# Patient Record
Sex: Female | Born: 1995 | Race: Asian | Hispanic: No | Marital: Single | Smoking: Never smoker
Health system: Southern US, Community
[De-identification: ages and names within clinical notes are randomized; demographics above are authoritative.]

---

## 2017-03-24 ENCOUNTER — Emergency Department (HOSPITAL_COMMUNITY): Payer: BLUE CROSS/BLUE SHIELD | Admitting: Anesthesiology

## 2017-03-24 ENCOUNTER — Ambulatory Visit (HOSPITAL_COMMUNITY)
Admission: EM | Admit: 2017-03-24 | Discharge: 2017-03-24 | Disposition: A | Discharge status: Home / Self Care | Payer: BLUE CROSS/BLUE SHIELD | Attending: Emergency Medicine | Admitting: Emergency Medicine

## 2017-03-24 ENCOUNTER — Other Ambulatory Visit: Payer: Self-pay

## 2017-03-24 ENCOUNTER — Emergency Department (HOSPITAL_COMMUNITY): Payer: BLUE CROSS/BLUE SHIELD

## 2017-03-24 ENCOUNTER — Encounter (HOSPITAL_COMMUNITY)
Admission: EM | Disposition: A | Discharge status: Home / Self Care | Payer: Self-pay | Source: Home / Self Care | Attending: Emergency Medicine

## 2017-03-24 ENCOUNTER — Encounter (HOSPITAL_COMMUNITY): Payer: Self-pay | Admitting: Emergency Medicine

## 2017-03-24 DIAGNOSIS — S65012A Laceration of ulnar artery at wrist and hand level of left arm, initial encounter: Secondary | ICD-10-CM | POA: Diagnosis not present

## 2017-03-24 DIAGNOSIS — S66127A Laceration of flexor muscle, fascia and tendon of left little finger at wrist and hand level, initial encounter: Secondary | ICD-10-CM | POA: Diagnosis not present

## 2017-03-24 DIAGNOSIS — S65312A Laceration of deep palmar arch of left hand, initial encounter: Secondary | ICD-10-CM | POA: Insufficient documentation

## 2017-03-24 DIAGNOSIS — S66123A Laceration of flexor muscle, fascia and tendon of left middle finger at wrist and hand level, initial encounter: Secondary | ICD-10-CM | POA: Insufficient documentation

## 2017-03-24 DIAGNOSIS — W458XXA Other foreign body or object entering through skin, initial encounter: Secondary | ICD-10-CM | POA: Insufficient documentation

## 2017-03-24 DIAGNOSIS — S61412A Laceration without foreign body of left hand, initial encounter: Secondary | ICD-10-CM

## 2017-03-24 DIAGNOSIS — Y93G1 Activity, food preparation and clean up: Secondary | ICD-10-CM | POA: Insufficient documentation

## 2017-03-24 DIAGNOSIS — S66125A Laceration of flexor muscle, fascia and tendon of left ring finger at wrist and hand level, initial encounter: Secondary | ICD-10-CM | POA: Diagnosis not present

## 2017-03-24 HISTORY — PX: INCISION AND DRAINAGE OF WOUND: SHX1803

## 2017-03-24 LAB — CBC WITH DIFFERENTIAL/PLATELET
BASOS ABS: 0 10*3/uL (ref 0.0–0.1)
BASOS PCT: 1 %
Eosinophils Absolute: 0.1 10*3/uL (ref 0.0–0.7)
Eosinophils Relative: 2 %
HEMATOCRIT: 38.6 % (ref 36.0–46.0)
HEMOGLOBIN: 13 g/dL (ref 12.0–15.0)
Lymphocytes Relative: 34 %
Lymphs Abs: 2.7 10*3/uL (ref 0.7–4.0)
MCH: 30.2 pg (ref 26.0–34.0)
MCHC: 33.7 g/dL (ref 30.0–36.0)
MCV: 89.6 fL (ref 78.0–100.0)
MONOS PCT: 5 %
Monocytes Absolute: 0.4 10*3/uL (ref 0.1–1.0)
NEUTROS ABS: 4.7 10*3/uL (ref 1.7–7.7)
NEUTROS PCT: 58 %
Platelets: 312 10*3/uL (ref 150–400)
RBC: 4.31 MIL/uL (ref 3.87–5.11)
RDW: 12.6 % (ref 11.5–15.5)
WBC: 8 10*3/uL (ref 4.0–10.5)

## 2017-03-24 SURGERY — IRRIGATION AND DEBRIDEMENT WOUND
Anesthesia: General | Site: Hand | Laterality: Left

## 2017-03-24 MED ORDER — ONDANSETRON HCL 4 MG/2ML IJ SOLN
INTRAMUSCULAR | Status: DC | PRN
  Administered 2017-03-24: 4 mg via INTRAVENOUS

## 2017-03-24 MED ORDER — LACTATED RINGERS IV SOLN
INTRAVENOUS | Status: DC | PRN
  Administered 2017-03-24: 02:00:00 via INTRAVENOUS

## 2017-03-24 MED ORDER — FENTANYL CITRATE (PF) 100 MCG/2ML IJ SOLN
INTRAMUSCULAR | Status: AC
  Filled 2017-03-24: qty 2

## 2017-03-24 MED ORDER — KETOROLAC TROMETHAMINE 30 MG/ML IJ SOLN
INTRAMUSCULAR | Status: DC | PRN
  Administered 2017-03-24: 30 mg via INTRAVENOUS

## 2017-03-24 MED ORDER — DEXAMETHASONE SODIUM PHOSPHATE 10 MG/ML IJ SOLN
INTRAMUSCULAR | Status: DC | PRN
  Administered 2017-03-24: 10 mg via INTRAVENOUS

## 2017-03-24 MED ORDER — NORETHIN ACE-ETH ESTRAD-FE 1.5-30 MG-MCG PO TABS: 1.0000 | ORAL_TABLET | Freq: Every day | ORAL | Status: DC

## 2017-03-24 MED ORDER — MIDAZOLAM HCL 5 MG/5ML IJ SOLN
INTRAMUSCULAR | Status: DC | PRN
  Administered 2017-03-24: 2 mg via INTRAVENOUS

## 2017-03-24 MED ORDER — FENTANYL CITRATE (PF) 100 MCG/2ML IJ SOLN
INTRAMUSCULAR | Status: DC | PRN
  Administered 2017-03-24: 50 ug via INTRAVENOUS
  Administered 2017-03-24: 25 ug via INTRAVENOUS
  Administered 2017-03-24: 50 ug via INTRAVENOUS
  Administered 2017-03-24: 100 ug via INTRAVENOUS
  Administered 2017-03-24 (×3): 25 ug via INTRAVENOUS

## 2017-03-24 MED ORDER — 0.9 % SODIUM CHLORIDE (POUR BTL) OPTIME
TOPICAL | Status: DC | PRN
  Administered 2017-03-24: 1000 mL

## 2017-03-24 MED ORDER — HYDROCODONE-ACETAMINOPHEN 5-325 MG PO TABS: 1.0000 | ORAL_TABLET | ORAL | 0 refills | Status: AC | PRN

## 2017-03-24 MED ORDER — CEPHALEXIN 250 MG PO CAPS: 250.0000 mg | ORAL_CAPSULE | Freq: Four times a day (QID) | ORAL | 0 refills | Status: AC

## 2017-03-24 MED ORDER — LACTATED RINGERS IV SOLN: INTRAVENOUS | Status: DC

## 2017-03-24 MED ORDER — HYDROMORPHONE HCL 1 MG/ML IJ SOLN: 0.2500 mg | INTRAMUSCULAR | Status: DC | PRN

## 2017-03-24 MED ORDER — LIDOCAINE-EPINEPHRINE (PF) 2 %-1:200000 IJ SOLN
30.0000 mL | Freq: Once | INTRAMUSCULAR | Status: AC
  Administered 2017-03-24: 30 mL via INTRADERMAL

## 2017-03-24 MED ORDER — CEFAZOLIN SODIUM-DEXTROSE 1-4 GM/50ML-% IV SOLN
1.0000 g | Freq: Once | INTRAVENOUS | Status: AC
  Administered 2017-03-24: 2 g via INTRAVENOUS

## 2017-03-24 MED ORDER — SUCCINYLCHOLINE CHLORIDE 200 MG/10ML IV SOSY
PREFILLED_SYRINGE | INTRAVENOUS | Status: DC | PRN
Start: 2017-03-24 — End: 2017-03-24
  Administered 2017-03-24: 120 mg via INTRAVENOUS

## 2017-03-24 MED ORDER — CEFAZOLIN SODIUM-DEXTROSE 2-4 GM/100ML-% IV SOLN
INTRAVENOUS | Status: AC
  Filled 2017-03-24: qty 100

## 2017-03-24 MED ORDER — LIDOCAINE-EPINEPHRINE (PF) 2 %-1:200000 IJ SOLN
INTRAMUSCULAR | Status: AC
  Filled 2017-03-24: qty 20

## 2017-03-24 MED ORDER — PROPOFOL 10 MG/ML IV BOLUS
INTRAVENOUS | Status: DC | PRN
  Administered 2017-03-24: 160 mg via INTRAVENOUS
  Administered 2017-03-24: 30 mg via INTRAVENOUS

## 2017-03-24 MED ORDER — LIDOCAINE 2% (20 MG/ML) 5 ML SYRINGE
INTRAMUSCULAR | Status: DC | PRN
Start: 2017-03-24 — End: 2017-03-24
  Administered 2017-03-24: 50 mg via INTRAVENOUS

## 2017-03-24 MED ORDER — PROPOFOL 10 MG/ML IV BOLUS
INTRAVENOUS | Status: AC
  Filled 2017-03-24: qty 20

## 2017-03-24 MED ORDER — MIDAZOLAM HCL 2 MG/2ML IJ SOLN
INTRAMUSCULAR | Status: AC
  Filled 2017-03-24: qty 2

## 2017-03-24 MED ORDER — TETANUS-DIPHTH-ACELL PERTUSSIS 5-2.5-18.5 LF-MCG/0.5 IM SUSP: 0.5000 mL | Freq: Once | INTRAMUSCULAR | Status: DC

## 2017-03-24 SURGICAL SUPPLY — 49 items
BAG ZIPLOCK 12X15 (MISCELLANEOUS) ×3 IMPLANT
BNDG CONFORM 3 STRL LF (GAUZE/BANDAGES/DRESSINGS) IMPLANT
BNDG ELASTIC 2X5.8 VLCR STR LF (GAUZE/BANDAGES/DRESSINGS) ×3 IMPLANT
BNDG GAUZE ELAST 4 BULKY (GAUZE/BANDAGES/DRESSINGS) IMPLANT
CORD BIPOLAR FORCEPS 12FT (ELECTRODE) ×3 IMPLANT
COVER SURGICAL LIGHT HANDLE (MISCELLANEOUS) ×3 IMPLANT
CUFF TOURN SGL QUICK 18 (TOURNIQUET CUFF) ×3 IMPLANT
DECANTER SPIKE VIAL GLASS SM (MISCELLANEOUS) ×3 IMPLANT
DEPRESSOR TONGUE BLADE STERILE (MISCELLANEOUS) IMPLANT
DRAPE SURG 17X11 SM STRL (DRAPES) ×3 IMPLANT
ELECT REM PT RETURN 15FT ADLT (MISCELLANEOUS) ×3 IMPLANT
GAUZE SPONGE 4X4 12PLY STRL (GAUZE/BANDAGES/DRESSINGS) ×3 IMPLANT
GAUZE SPONGE 4X4 16PLY XRAY LF (GAUZE/BANDAGES/DRESSINGS) IMPLANT
GAUZE XEROFORM 5X9 LF (GAUZE/BANDAGES/DRESSINGS) ×3 IMPLANT
GLOVE BIO SURGEON STRL SZ8 (GLOVE) ×3 IMPLANT
GLOVE BIOGEL M 8.0 STRL (GLOVE) ×3 IMPLANT
GLOVE BIOGEL PI IND STRL 6.5 (GLOVE) ×1 IMPLANT
GLOVE BIOGEL PI IND STRL 7.0 (GLOVE) ×1 IMPLANT
GLOVE BIOGEL PI IND STRL 7.5 (GLOVE) ×1 IMPLANT
GLOVE BIOGEL PI INDICATOR 6.5 (GLOVE) ×2
GLOVE BIOGEL PI INDICATOR 7.0 (GLOVE) ×2
GLOVE BIOGEL PI INDICATOR 7.5 (GLOVE) ×2
KIT BASIN OR (CUSTOM PROCEDURE TRAY) ×3 IMPLANT
NEEDLE HYPO 25X1 1.5 SAFETY (NEEDLE) IMPLANT
NS IRRIG 1000ML POUR BTL (IV SOLUTION) ×3 IMPLANT
PACK ORTHO EXTREMITY (CUSTOM PROCEDURE TRAY) ×3 IMPLANT
PAD CAST 3X4 CTTN HI CHSV (CAST SUPPLIES) IMPLANT
PAD CAST 4YDX4 CTTN HI CHSV (CAST SUPPLIES) ×1 IMPLANT
PADDING CAST COTTON 3X4 STRL (CAST SUPPLIES)
PADDING CAST COTTON 4X4 STRL (CAST SUPPLIES) ×2
PASSER SUT SWANSON 36MM LOOP (INSTRUMENTS) IMPLANT
POSITIONER SURGICAL ARM (MISCELLANEOUS) ×3 IMPLANT
SOL PREP POV-IOD 4OZ 10% (MISCELLANEOUS) ×3 IMPLANT
SOL PREP PROV IODINE SCRUB 4OZ (MISCELLANEOUS) ×3 IMPLANT
SPEAR EYE SURGICAL ST (MISCELLANEOUS) IMPLANT
SPLINT FIBERGLASS 4X15 (CAST SUPPLIES) ×3 IMPLANT
STOCKINETTE 4X48 STRL (DRAPES) ×3 IMPLANT
SUT CHROMIC 5 0 P 3 (SUTURE) IMPLANT
SUT ETHILON 4 0 PS 2 18 (SUTURE) ×3 IMPLANT
SUT ETHILON 9 0 V 100.4 (SUTURE) IMPLANT
SUT FIBERWIRE 4-0 18 DIAM BLUE (SUTURE) ×3
SUT MERSILENE 4 0 P 3 (SUTURE) IMPLANT
SUT PROLENE 3 0 PS 2 (SUTURE) IMPLANT
SUT PROLENE 4 0 PS 2 18 (SUTURE) ×6 IMPLANT
SUT VIC AB 3-0 SH 27 (SUTURE) ×2
SUT VIC AB 3-0 SH 27X BRD (SUTURE) ×1 IMPLANT
SUT VIC AB 4-0 PS2 27 (SUTURE) ×3 IMPLANT
SUTURE FIBERWR 4-0 18 DIA BLUE (SUTURE) ×1 IMPLANT
WATER STERILE IRR 1000ML POUR (IV SOLUTION) ×3 IMPLANT

## 2017-03-24 NOTE — H&P (Signed)
Reason for Consult:laceration Referring Physician: [facility]  CC:I cut my hand  HPI:  April Krueger is an 22 y.o. right handed female who presents with laceration and active bleeding from laceration to left hand.  Pt was wasing dishes this evening and cut her left hand, c/o excess bleeding.  unabled to be controlled with direct pressure.  Pt presented to ER and bleeding continued, a tourniquet was inflated to slow/stop the bleeding .   Pain is rated at   8 /10 and is described as sharp.  Pain is constant.  Pain is made better by rest/immobilization, worse with motion.   Associated signs/symptoms: active pulsatile bleeding, tourniquet stops bleeding Previous treatment:    History reviewed. No pertinent past medical history.  Asthma  History reviewed. No pertinent surgical history.  History reviewed. No pertinent family history.  Social History:  reports that  has never smoked. she has never used smokeless tobacco. She reports that she does not drink alcohol or use drugs.  Allergies: No Known Allergies  Medications: I have reviewed the patient's current medications.  Results for orders placed or performed during the hospital encounter of 03/24/17 (from the past 48 hour(s))  CBC with Differential     Status: None   Collection Time: 03/24/17  1:31 AM  Result Value Ref Range   WBC 8.0 4.0 - 10.5 K/uL   RBC 4.31 3.87 - 5.11 MIL/uL   Hemoglobin 13.0 12.0 - 15.0 g/dL   HCT 16.138.6 09.636.0 - 04.546.0 %   MCV 89.6 78.0 - 100.0 fL   MCH 30.2 26.0 - 34.0 pg   MCHC 33.7 30.0 - 36.0 g/dL   RDW 40.912.6 81.111.5 - 91.415.5 %   Platelets 312 150 - 400 K/uL   Neutrophils Relative % 58 %   Neutro Abs 4.7 1.7 - 7.7 K/uL   Lymphocytes Relative 34 %   Lymphs Abs 2.7 0.7 - 4.0 K/uL   Monocytes Relative 5 %   Monocytes Absolute 0.4 0.1 - 1.0 K/uL   Eosinophils Relative 2 %   Eosinophils Absolute 0.1 0.0 - 0.7 K/uL   Basophils Relative 1 %   Basophils Absolute 0.0 0.0 - 0.1 K/uL    Comment: Performed at [facility], [address] [place], [place] [id number]    No results found.  Pertinent items are noted in HPI.   General appearance: alert and cooperative Resp: clear to auscultation bilaterally Cardio: regular rate and rhythm GI: soft, non-tender; bowel sounds normal; no masses,  no organomegaly Extremities: extremities normal, atraumatic, no cyanosis or edema Except for left palm with ~3cm laceration at mid palm ulnar side with active pulsatile bleeding, pt able to move/flex all fingers, sensory exam difficult to assess Pt with heirloom bracelet on wrist Lesly Rubenstein(jade material) unable to remove Assessment: Laceration left palm, with arterial, ? Nerve injury  Plan: needs emergent exploration, repair I have discussed this treatment plan in detail with patient, including the risks of the recommended treatment or surgery, the benefits and the alternatives.  The patient understands that additional treatment may be necessary.  Ilena Dieckman C Harsha Yusko 03/24/2017, 1:48 AM

## 2017-03-24 NOTE — ED Notes (Signed)
All bracelets cut off of patient except on jade bracelet that we are unable to cut d/t thickness. She is unable to get it off. Dr Izora Ribasoley made aware.

## 2017-03-24 NOTE — ED Triage Notes (Signed)
Patient was washing dishes and cut the palm of her left hand. Patient had arterial blood coming out.

## 2017-03-24 NOTE — Op Note (Signed)
NAMERACHEL, RISON NO.:  [id number]  MEDICAL RECORD NO.:  [id number]  LOCATION:  ED                           FACILITY:  [facility]  PHYSICIAN:  Johnette Abraham, MD    DATE OF BIRTH:  10/31/1995  DATE OF PROCEDURE:  03/24/2017 DATE OF DISCHARGE:                              OPERATIVE REPORT   PREOPERATIVE DIAGNOSIS:  Complex laceration to the left palm with presumed tendon-nerve and arterial laceration.  POSTOPERATIVE DIAGNOSIS:  Complex laceration to the left palm with presumed tendon-nerve and arterial laceration.  PROCEDURES PERFORMED: 1. Extensive exploration of wound. 2. Incision release of the transverse carpal ligament. 3. Exploration of the median and ulnar nerves. 4. Exploration of the ulnar artery. 5. Exploration of flexor tendons to the thumb, index, long, ring, and     small fingers. 6. Repair of flexor tendon to the long finger, repair of flexor tendon     to the ring finger, and repair of flexor tendon to the small     finger. 7. Complex layered wound closure totaling approximately 12 cm.  INDICATIONS:  Ms. Wilbourne is a 22 year old female who was washing dishes this evening at home and sustained a laceration to her left palm.  She complained of pain and excessive bleeding.  She presented to the emergency room, where bleeding could not be controlled with direct pressure.  A tourniquet was needed to control the bleeding.  I was consulted urgently.  She was evaluated in the emergency room.  She had an obvious arterial injury with pulsatile bleeding from the wound.  She had evidence, by the position of her fingers, that she had some flexor tendon injury.  Her neurologic exam was difficult because of the inflation of the tourniquet to stop the bleeding.  Risks, benefits, and alternatives of surgery were discussed with the patient and her significant other.  She agreed with this course of action.  Consent was obtained.  DESCRIPTION OF PROCEDURE:   The patient was taken to the operating room and placed supine on the operating room table.  Time-out was performed. Preoperative antibiotics were given.  General anesthesia was administered without difficulty.  Of note, the patient had an Heirloom Jade bracelet on the left wrist.  This was unable to be removed.  This was prepped into the field sterilely.  The upper extremity was prepped and draped sterilely.  The tourniquet was switched out to the upper arm and inflated.  To gain access to the important structures, the wound was lengthened both proximally and distally.  Thorough irrigation of the wound was then performed.  It was evident that there were some flexor tendon laceration.  There was also deep staining down into the floor of the carpal canal.  To gain access to the proximal portions of the flexor tendons that were lacerated, the transverse carpal ligament was incised to the distal wrist crease.  Once this was performed, the median nerve was traced.  It was without damage.  There were lacerations to the flexor tendons to the long finger, ring finger, and small finger.  Each of these were independently repaired with a modified Kessler repair with 4-0 FiberWire for nice  result.  Following the source of the arterial bleeding was explored, Guyon's canal was opened.  The ulnar nerve was isolated and traced out to its branches to the ring and small fingers. There did not appear to be any laceration to the nerve.  The ulnar artery was also visualized and traced out.  There was a small branch of the ulnar artery that appeared to be traveling to the palmar arch and in the vicinity of the ring finger just proximal to the distal palmar crease.  These two ends were clamped.  There did not appear to be any other arterial or large venous structure that could account for the amount of bleeding that the patient had.  The tourniquet was released. There was no pulsatile bleeding.  The ulnar artery  was visualized in the field in a pulsatile manner.  The small branch off the ulnar artery and arch, that was isolated initially, remained clamped.  While clamped, all fingers were nice and pink, and therefore ligation of the 2 ends of the small branch was feasible.  This was performed with a 4-0 Vicryl tie. Afterwards, the wound was thoroughly irrigated again.  Hemostasis was achieved with bipolar and direct pressure.  The large wound that was made for exposure was closed in layers with 3-0 Vicryl into some of the palmar fascia and approximated the subcutaneous tissues, followed by multiple interrupted 4-0 nylon sutures for a nice repair.  Xeroform ointment, 4x4s, and a splint were placed, and metacarpophalangeal joint was flexed to approximately 75 degrees.  The patient tolerated the procedure well and was taken to recovery room in a stable condition.     Johnette AbrahamHarrill C Jensyn Cambria, MD     HCC/MEDQ  D:  03/24/2017  T:  03/24/2017  Job:  [id number]

## 2017-03-24 NOTE — Transfer of Care (Signed)
Immediate Anesthesia Transfer of Care Note  Patient: April Krueger  Procedure(s) Performed: Procedure(s): LACERATION REPAIR LEFT HAND (Left)  Patient Location: PACU  Anesthesia Type:General  Level of Consciousness:  sedated, patient cooperative and responds to stimulation  Airway & Oxygen Therapy:Patient Spontanous Breathing and Patient connected to face mask oxgen  Post-op Assessment:  Report given to PACU RN and Post -op Vital signs reviewed and stable  Post vital signs:  Reviewed and stable  Last Vitals: There were no vitals filed for this visit.  Complications: No apparent anesthesia complications

## 2017-03-24 NOTE — Anesthesia Preprocedure Evaluation (Signed)
Anesthesia Evaluation  Patient identified by MRN, date of birth, ID band Patient awake    Reviewed: Allergy & Precautions, NPO status , Patient's Chart, lab work & pertinent test results  Airway Mallampati: II  TM Distance: >3 FB Neck ROM: Full    Dental  (+) Dental Advisory Given   Pulmonary neg pulmonary ROS,    breath sounds clear to auscultation       Cardiovascular negative cardio ROS   Rhythm:Regular Rate:Normal     Neuro/Psych negative neurological ROS     GI/Hepatic negative GI ROS, Neg liver ROS,   Endo/Other  negative endocrine ROS  Renal/GU negative Renal ROS     Musculoskeletal Hand laceration   Abdominal   Peds  Hematology negative hematology ROS (+)   Anesthesia Other Findings   Reproductive/Obstetrics                             Lab Results  Component Value Date   WBC 8.0 03/24/2017   HGB 13.0 03/24/2017   HCT 38.6 03/24/2017   MCV 89.6 03/24/2017   PLT 312 03/24/2017    Anesthesia Physical Anesthesia Plan  ASA: I and emergent  Anesthesia Plan: General   Post-op Pain Management:    Induction: Intravenous and Rapid sequence  PONV Risk Score and Plan: 3 and Dexamethasone, Ondansetron and Treatment may vary due to age or medical condition  Airway Management Planned: Oral ETT  Additional Equipment:   Intra-op Plan:   Post-operative Plan: Extubation in OR  Informed Consent: I have reviewed the patients History and Physical, chart, labs and discussed the procedure including the risks, benefits and alternatives for the proposed anesthesia with the patient or authorized representative who has indicated his/her understanding and acceptance.   Dental advisory given  Plan Discussed with: CRNA  Anesthesia Plan Comments:         Anesthesia Quick Evaluation

## 2017-03-24 NOTE — ED Notes (Signed)
This RN has been in this patients room since 0053 holding pressure and controlling bleeding. Patient has been called to the OR before I have been able to give her her TDAP or her antibiotics. They are aware.

## 2017-03-24 NOTE — ED Provider Notes (Signed)
[facility] Provider Note   CSN: [id number] Arrival date & time: 03/24/17  0029     History   Chief Complaint Chief Complaint  Patient presents with  . Extremity Laceration    HPI April Krueger is a 22 y.o. female who presents for evaluation left hand laceration that occurred approximately 1 hour prior to ED arrival.  Patient reports that she was washing dishes when the plate slipped and fell and cracked, causing a laceration to the palmar aspect of her left hand.  Patient reports that there was immediate bleeding.  She wrapped the hand and with a napkin and came to the ED.  Patient reports that she had some intermittent numbness/tingling to the distal aspects of her finger.  Patient does not know when her last tetanus shot was.  Patient denies any other cuts or lacerations.  The history is provided by the patient.    History reviewed. No pertinent past medical history.  There are no active problems to display for this patient.   History reviewed. No pertinent surgical history.  OB History    No data available       Home Medications    Prior to Admission medications   Medication Sig Start Date End Date Taking? Authorizing Provider  BLISOVI FE 1.5/30 1.5-30 MG-MCG tablet Take 1 tablet by mouth daily. 03/08/17  Yes [provider]  cephALEXin (KEFLEX) 250 MG capsule Take 1 capsule (250 mg total) by mouth 4 (four) times daily for 5 days. 03/24/17 03/29/17  Knute Neuoley, Harrill, MD  HYDROcodone-acetaminophen (NORCO/VICODIN) 5-325 MG tablet Take 1 tablet by mouth every 4 (four) hours as needed for up to 7 days for moderate pain. 03/24/17 03/31/17  Knute Neuoley, Harrill, MD    Family History History reviewed. No pertinent family history.  Social History Social History   Tobacco Use  . Smoking status: Never Smoker  . Smokeless tobacco: Never Used  Substance Use Topics  . Alcohol use: No    Frequency: Never  . Drug use: No     Allergies   Patient  has no known allergies.   Review of Systems Review of Systems  Skin: Positive for wound.  Neurological: Positive for numbness. Negative for weakness.  All other systems reviewed and are negative.    Physical Exam Updated Vital Signs BP 124/85 (BP Location: Right Arm)   Pulse 100   Temp 98.5 F (36.9 C)   Resp 16   LMP 03/03/2017   SpO2 99%   Physical Exam  Constitutional: She appears well-developed and well-nourished.  Appears uncomfortable  HENT:  Head: Normocephalic and atraumatic.  Eyes: Conjunctivae and EOM are normal. Right eye exhibits no discharge. Left eye exhibits no discharge. No scleral icterus.  Cardiovascular: Normal rate and regular rhythm.  Pulses:      Radial pulses are 2+ on the right side, and 2+ on the left side.  Pulmonary/Chest: Effort normal.  Musculoskeletal:  Tenderness to palpation to left palmar aspect.  No deformity or crepitus noted.  Patient able move all 5 digits without any difficulty.  Neurological: She is alert.  Subjective decreased sensation noted to digits 2-3 and 4 of left hand.  Skin: Skin is warm and dry. Capillary refill takes less than 2 seconds.  3 cm linear laceration noted to the ulnar aspect of the left palm with profuse pulsating bleeding.  Good distal cap refill to all 5 digits.  Old healed scars noted to the right wrist.  Psychiatric: She has a  normal mood and affect. Her speech is normal and behavior is normal.  Nursing note and vitals reviewed.    ED Treatments / Results  Labs (all labs ordered are listed, but only abnormal results are displayed) Labs Reviewed  CBC WITH DIFFERENTIAL/PLATELET  I-STAT BETA HCG BLOOD, ED ([facility], [facility], [facility] ONLY)  I-STAT CHEM 8, ED    EKG  EKG Interpretation None       Radiology Dg Hand Complete Left  Result Date: 03/24/2017 CLINICAL DATA:  Laceration to the palm of the left hand while washing dishes. Assess for foreign bodies. EXAM: LEFT HAND - COMPLETE 3+ VIEW COMPARISON:  None.  FINDINGS: No radiopaque foreign bodies are seen. The known soft tissue laceration is only partially characterized. There is no evidence of osseous disruption. Visualized joint spaces are preserved. The carpal rows appear grossly intact, and demonstrate normal alignment. IMPRESSION: No radiopaque foreign bodies seen. No evidence of osseous disruption. Electronically Signed   By: Roanna Raider M.D.   On: 03/24/2017 01:52    Procedures Procedures (including critical care time)  Medications Ordered in ED Medications  lidocaine-EPINEPHrine (XYLOCAINE W/EPI) 2 %-1:200000 (PF) injection (not administered)  Tdap (BOOSTRIX) injection 0.5 mL ( Intramuscular MAR Hold 03/24/17 0208)  ceFAZolin (ANCEF) 2-4 GM/100ML-% IVPB (not administered)  HYDROmorphone (DILAUDID) injection 0.25-0.5 mg (not administered)  norethindrone-ethinyl estradiol-iron (MICROGESTIN FE,GILDESS FE,LOESTRIN FE) 1.5-30 MG-MCG tablet 1 tablet (not administered)  lactated ringers infusion (not administered)  lidocaine-EPINEPHrine (XYLOCAINE W/EPI) 2 %-1:200000 (PF) injection 30 mL (30 mLs Intradermal Given 03/24/17 0151)  ceFAZolin (ANCEF) IVPB 1 g/50 mL premix (2 g Intravenous Given 03/24/17 0223)     Initial Impression / Assessment and Plan / ED Course  I have reviewed the triage vital signs and the nursing notes.  Pertinent labs & imaging results that were available during my care of the patient were reviewed by me and considered in my medical decision making (see chart for details).     22 year old female who presents for evaluation of left hand laceration that occurred approximately 1 hour prior to ED arrival.  Was washing plates and the plate slipped, causing a laceration to the palmar aspect of left hand.  Leading has not been controlled.  Tetanus is not up-to-date.  Patient reporting some numbness to digits 2, 3, 4 of left hand.  Good distal cap refill. Patient is afebril, non-toxic appearing, sitting comfortably on examination  table. Vital signs reviewed and stable.  On exam, patient with hand wrapped in gauze.  Gauze was removed and showed profuse pulsating bleeding noted to the palmar aspect of the left hand.  Attempted to apply pressure to stop bleeding with no success.  Lidocaine with epi was injected into the site to help with stabilization but did not improve bleeding.  A tourniquet was applied to the hand.  Given concerns about arterial bleeding will consult hand.  Plan for x-ray for foreign body evaluation.  Patient with no known drug allergies.  Will start her on Ancef.  Plan for basic labs.  1:13 AM: Discussed with Dr. Izora Ribas (hand).  He will come to the ED for evaluation with plans for OR repair.  Updated patient on plan.  She is agreeable to plan.    Final Clinical Impressions(s) / ED Diagnoses   Final diagnoses:  Laceration of left hand, foreign body presence unspecified, initial encounter    ED Discharge Orders        Ordered    cephALEXin (KEFLEX) 250 MG capsule  4 times daily  03/24/17 0351    HYDROcodone-acetaminophen (NORCO/VICODIN) 5-325 MG tablet  Every 4 hours PRN     03/24/17 0351       Maxwell Caul, PA-C 03/24/17 0618    Palumbo, April, MD 03/24/17 [phone]

## 2017-03-24 NOTE — Anesthesia Postprocedure Evaluation (Signed)
Anesthesia Post Note  Patient: April Krueger  Procedure(s) Performed: LACERATION REPAIR LEFT HAND (Left Hand)     Anesthesia Post Evaluation  Last Vitals:  Vitals:   03/24/17 0358 03/24/17 0400  BP: (!) 143/54 (!) 126/94  Pulse: (!) 121 (!) 121  Resp: 12   Temp: 37.1 C   SpO2: 100% 100%    Last Pain:  Vitals:   03/24/17 0358  PainSc: 8                  April Krueger

## 2017-03-24 NOTE — Anesthesia Procedure Notes (Signed)
Procedure Name: Intubation Date/Time: 03/24/2017 2:16 AM Performed by: Anne Fu, CRNA Pre-anesthesia Checklist: Patient identified, Emergency Drugs available, Suction available, Patient being monitored and Timeout performed Patient Re-evaluated:Patient Re-evaluated prior to induction Oxygen Delivery Method: Circle system utilized Preoxygenation: Pre-oxygenation with 100% oxygen Induction Type: IV induction Ventilation: Mask ventilation without difficulty Laryngoscope Size: Mac and 4 Grade View: Grade I Tube type: Oral Tube size: 7.0 mm Number of attempts: 1 Airway Equipment and Method: Stylet Placement Confirmation: ETT inserted through vocal cords under direct vision,  positive ETCO2 and breath sounds checked- equal and bilateral Secured at: 22 cm Tube secured with: Tape Dental Injury: Teeth and Oropharynx as per pre-operative assessment

## 2017-03-24 NOTE — Discharge Instructions (Signed)
Discharge Instructions:  Keep your dressing clean, dry and in place until instructed to remove by Dr. Jamiah Homeyer.  If the dressing becomes dirty or wet call the office for instructions during business hours. Elevate the extremity to help with swelling, this will also help with any discomfort. Take your medication as prescribed. No lifting with the injured  extremity. If you feel that the dressing is too tight, you may loosen it, but keep it on; finger tips should be pink; if there is a concern, call the office. [phone]  Please call the office on the next business day after discharge to arrange a follow up appointment.  Call [phone] between the hours of 9am - 5pm M-Th or 9am - 1pm on Fri. For most hand injuries and/or conditions, you may return to work using the uninjured hand (one handed duty) within 24-72 hours.  A detailed note will be provided to you at your follow up appointment or may contact the office prior to your follow up.    

## 2017-03-25 ENCOUNTER — Encounter (HOSPITAL_COMMUNITY): Payer: Self-pay | Admitting: General Surgery

## 2019-04-10 IMAGING — DX DG HAND COMPLETE 3+V*L*
3 series · 3 of 3 positions shown · non-contrast
Comparison: None.

CLINICAL DATA: Laceration to the palm of the left hand while
washing dishes. Assess for foreign bodies.

EXAM:
LEFT HAND - COMPLETE 3+ VIEW

[hand ap]
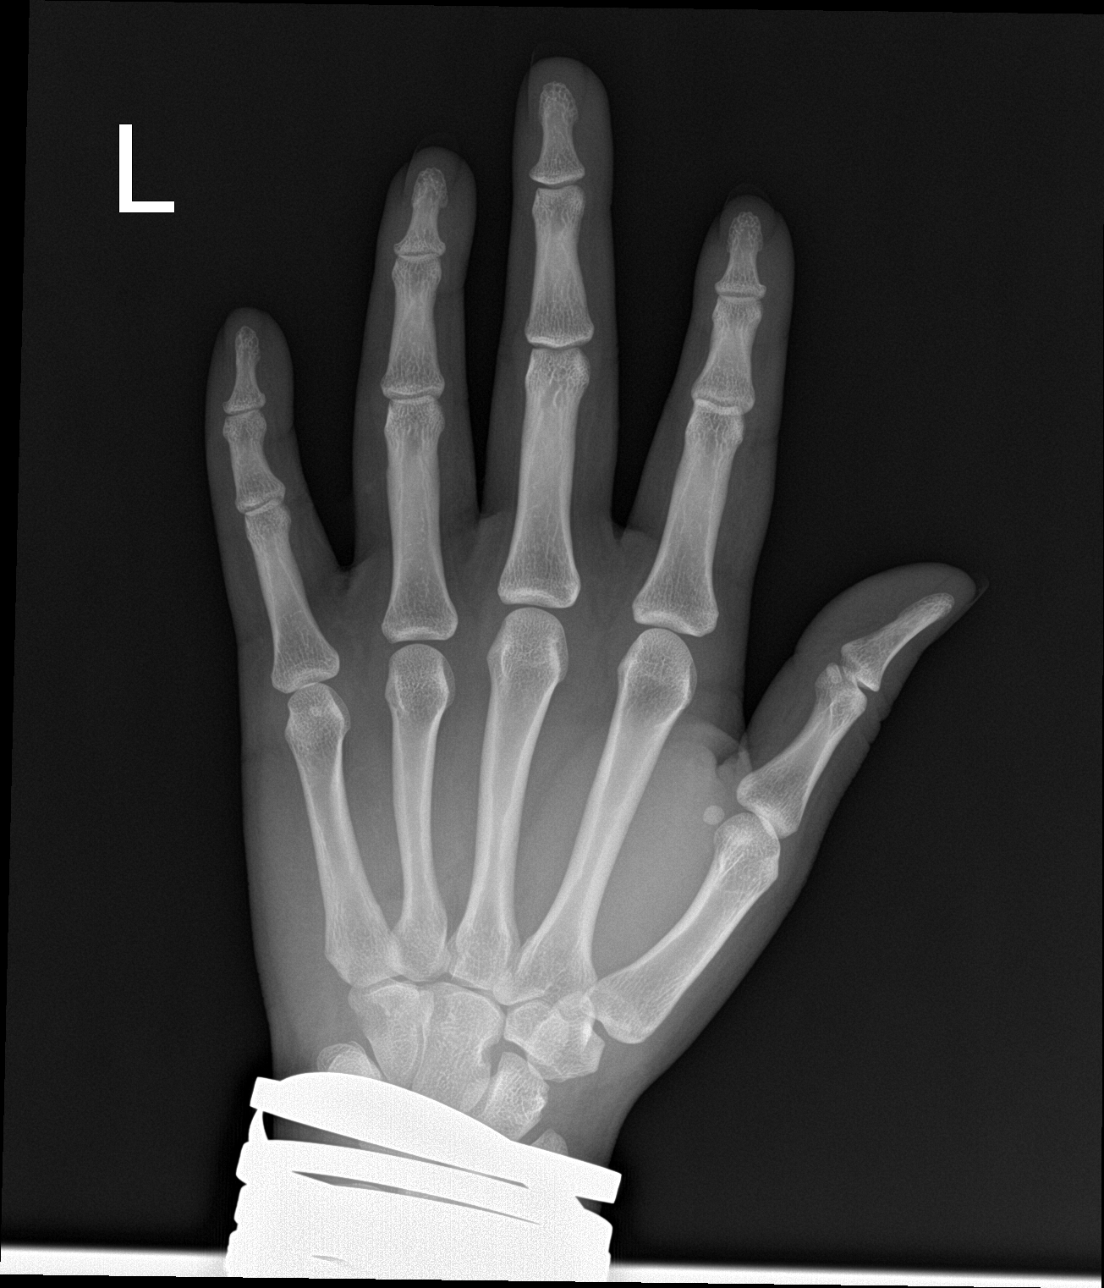

[hand obl]
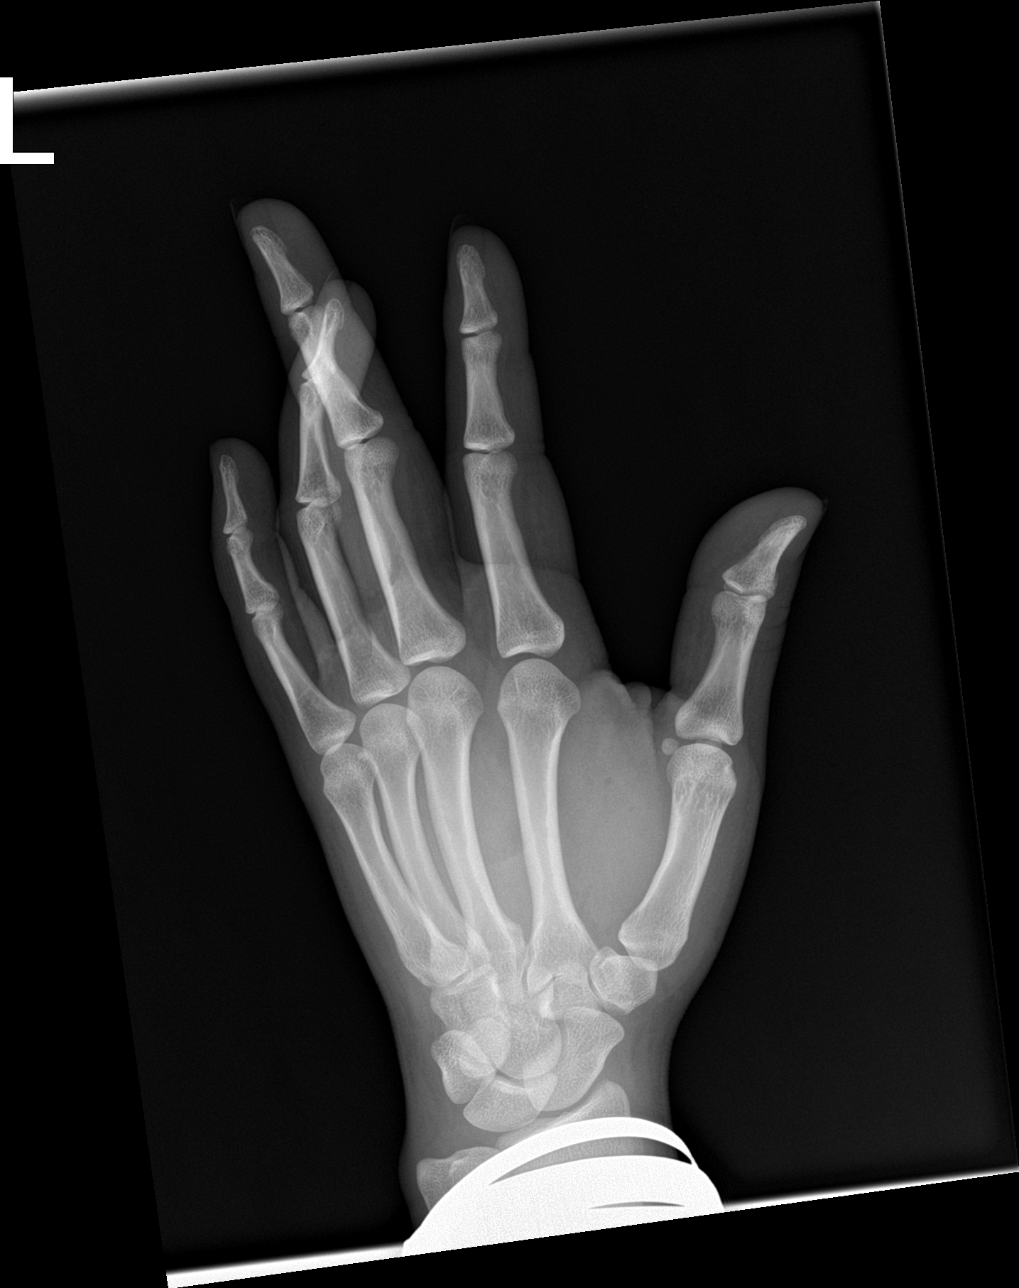

[hand lat]
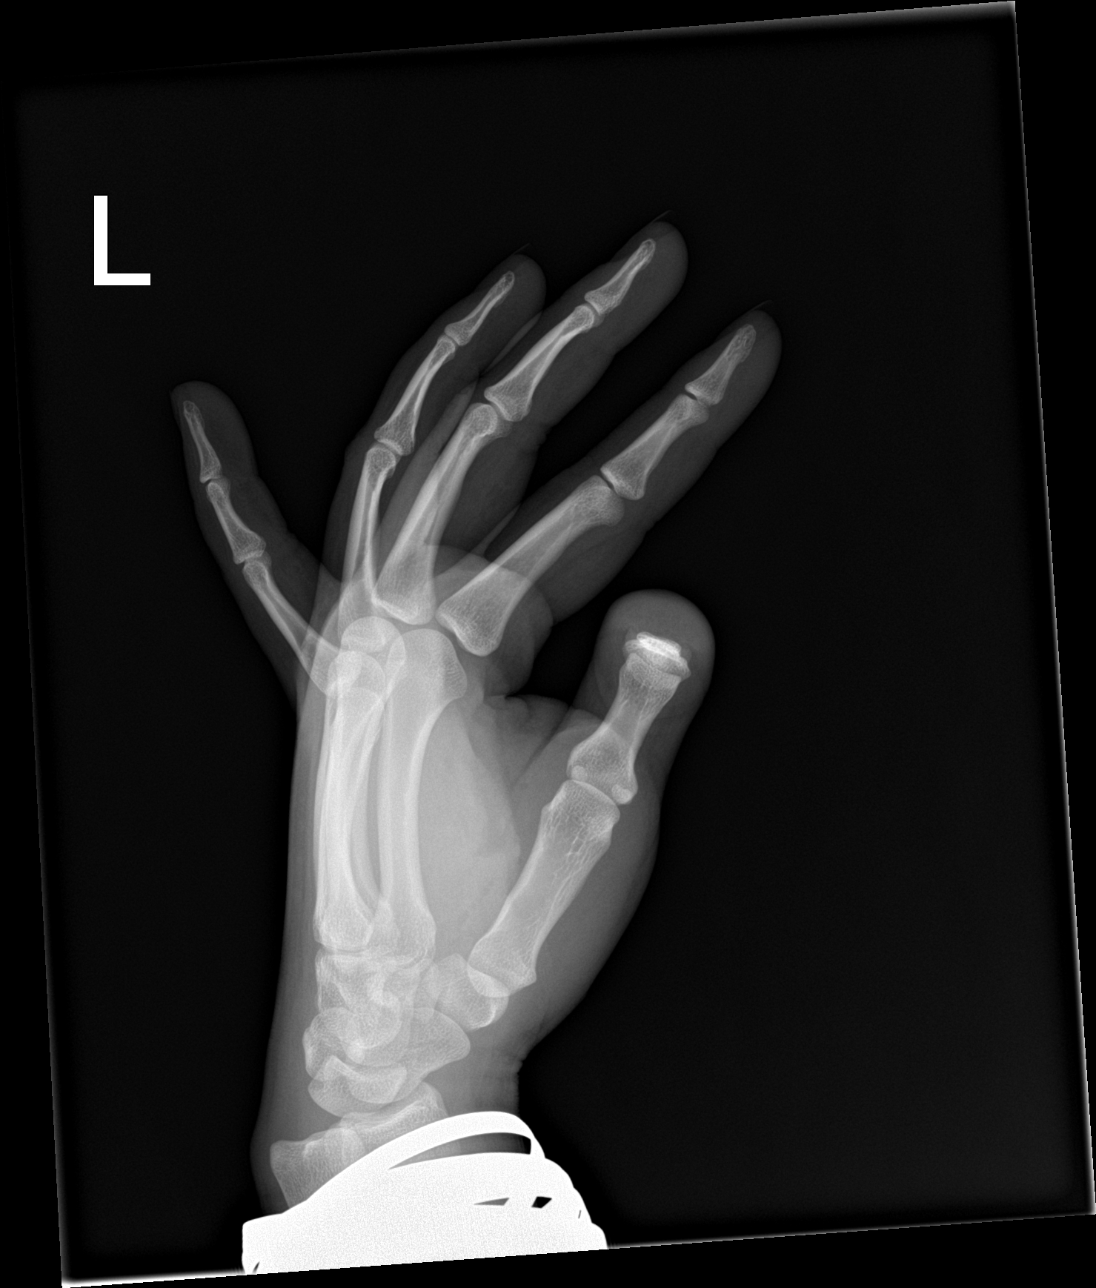

[3 of 3 positions shown; findings below may reference images not displayed]

FINDINGS: No radiopaque foreign bodies are seen. The known soft tissue
laceration is only partially characterized. There is no evidence of
osseous disruption.

Visualized joint spaces are preserved. The carpal rows appear
grossly intact, and demonstrate normal alignment.
IMPRESSION: No radiopaque foreign bodies seen. No evidence of osseous
disruption.
